# Patient Record
Sex: Female | Born: 1960 | Hispanic: Yes | Marital: Married
Health system: Southern US, Community
[De-identification: ages and names within clinical notes are randomized; demographics above are authoritative.]

---

## 2005-11-22 ENCOUNTER — Ambulatory Visit: Payer: Self-pay | Admitting: Gastroenterology

## 2005-11-25 ENCOUNTER — Ambulatory Visit: Payer: Self-pay | Admitting: Gastroenterology

## 2005-12-09 ENCOUNTER — Ambulatory Visit: Payer: Self-pay | Admitting: Gastroenterology

## 2008-01-25 ENCOUNTER — Emergency Department: Payer: Self-pay | Admitting: Emergency Medicine

## 2011-07-30 ENCOUNTER — Ambulatory Visit: Payer: Self-pay | Admitting: Family Medicine

## 2012-03-06 ENCOUNTER — Ambulatory Visit: Payer: Self-pay | Admitting: Family Medicine

## 2013-03-15 ENCOUNTER — Ambulatory Visit: Payer: Self-pay

## 2020-11-21 ENCOUNTER — Other Ambulatory Visit: Payer: Self-pay

## 2020-11-21 ENCOUNTER — Encounter: Payer: Self-pay | Admitting: Family Medicine

## 2020-11-21 ENCOUNTER — Ambulatory Visit
Admission: RE | Admit: 2020-11-21 | Discharge: 2020-11-21 | Disposition: A | Discharge status: Home / Self Care | Payer: Self-pay | Source: Ambulatory Visit | Attending: Oncology | Admitting: Oncology

## 2020-11-21 ENCOUNTER — Ambulatory Visit: Payer: Self-pay | Attending: Oncology

## 2020-11-21 VITALS — BP 137/75 | HR 69 | Temp 98.0°F | Resp 18 | Wt 180.3 lb

## 2020-11-21 DIAGNOSIS — Z0000 Encounter for general adult medical examination without abnormal findings: Secondary | ICD-10-CM | POA: Insufficient documentation

## 2020-11-21 NOTE — Progress Notes (Signed)
  Subjective:     Patient ID: Emanuel Otilio Miu, female   DOB: 09-18-1960, 60 y.o.   MRN: [id number]  HPI   Review of Systems     Objective:   Physical Exam Chest:  Breasts:    Right: No swelling, bleeding, inverted nipple, mass, nipple discharge, skin change or tenderness.     Left: No swelling, bleeding, inverted nipple, mass, nipple discharge, skin change or tenderness.       Assessment:     60 year old Hispanic patient presents for [facility] screening.  Patient screened, and meets [facility] eligibility.  Patient does not have insurance, Medicare or Medicaid.  Instructed patient on breast self awareness using teach back method.   Clinical breast exam unremarkable.  No mass or lump palpated.   Risk Assessment     Risk Scores       11/21/2020   Last edited by: Stephanie Coup S, CMA   5-year risk: 0.6 %   Lifetime risk: 3.4 %                Plan:     Sent for bilateral screening mammogram.

## 2020-11-26 NOTE — Progress Notes (Signed)
Letter mailed from [facility] to notify of normal mammogram results.  Patient to return in one year for annual screening.  Copy to [facility]. 

## 2021-11-12 ENCOUNTER — Encounter: Payer: Self-pay | Admitting: Family Medicine

## 2022-01-15 ENCOUNTER — Other Ambulatory Visit: Payer: Self-pay

## 2022-01-15 DIAGNOSIS — Z1231 Encounter for screening mammogram for malignant neoplasm of breast: Secondary | ICD-10-CM

## 2022-01-21 ENCOUNTER — Ambulatory Visit: Payer: Self-pay | Attending: Hematology and Oncology

## 2022-01-21 ENCOUNTER — Telehealth: Payer: Self-pay | Admitting: Hematology and Oncology

## 2023-08-05 IMAGING — MG MM DIGITAL SCREENING BILAT W/ TOMO AND CAD
6 of 12 series · 6 of 36 positions shown · non-contrast
Comparison: Previous exam(s).

CLINICAL DATA: Screening.

EXAM:
DIGITAL SCREENING BILATERAL MAMMOGRAM WITH TOMOSYNTHESIS AND CAD
TECHNIQUE: Bilateral screening digital craniocaudal and mediolateral oblique
mammograms were obtained. Bilateral screening digital breast
tomosynthesis was performed. The images were evaluated with
computer-aided detection.

[L CC synth-2D (1 of 2)]
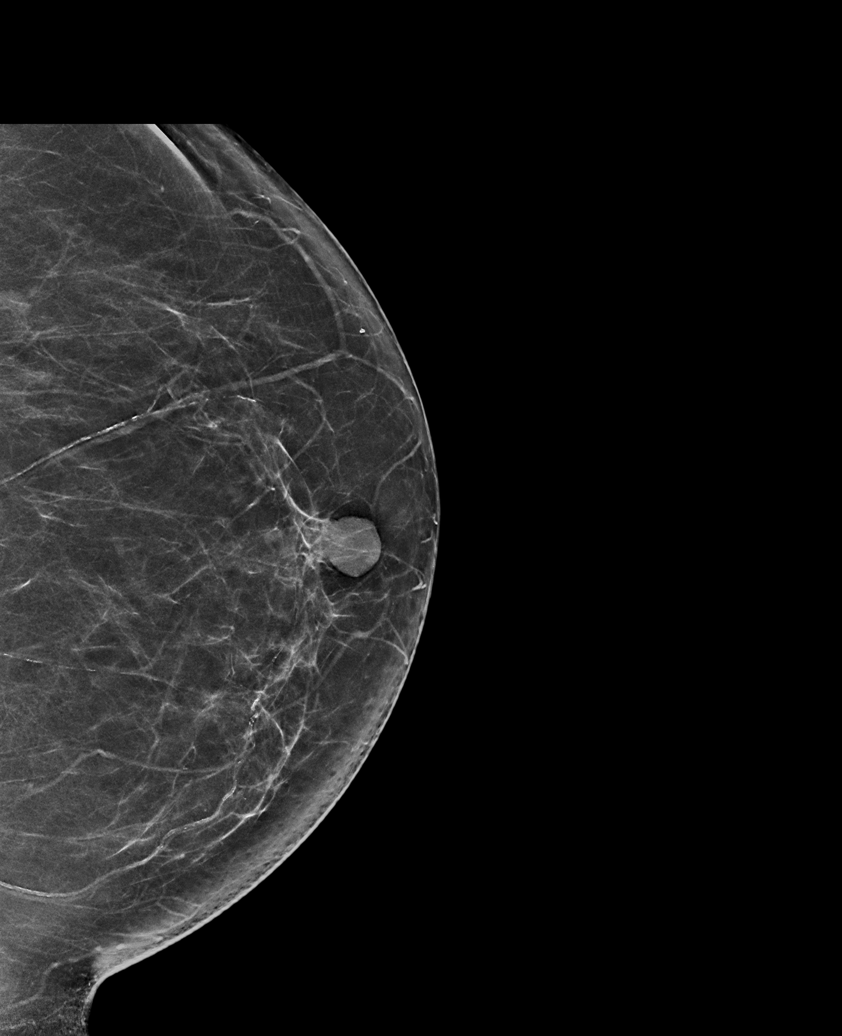

[L MLO synth-2D]
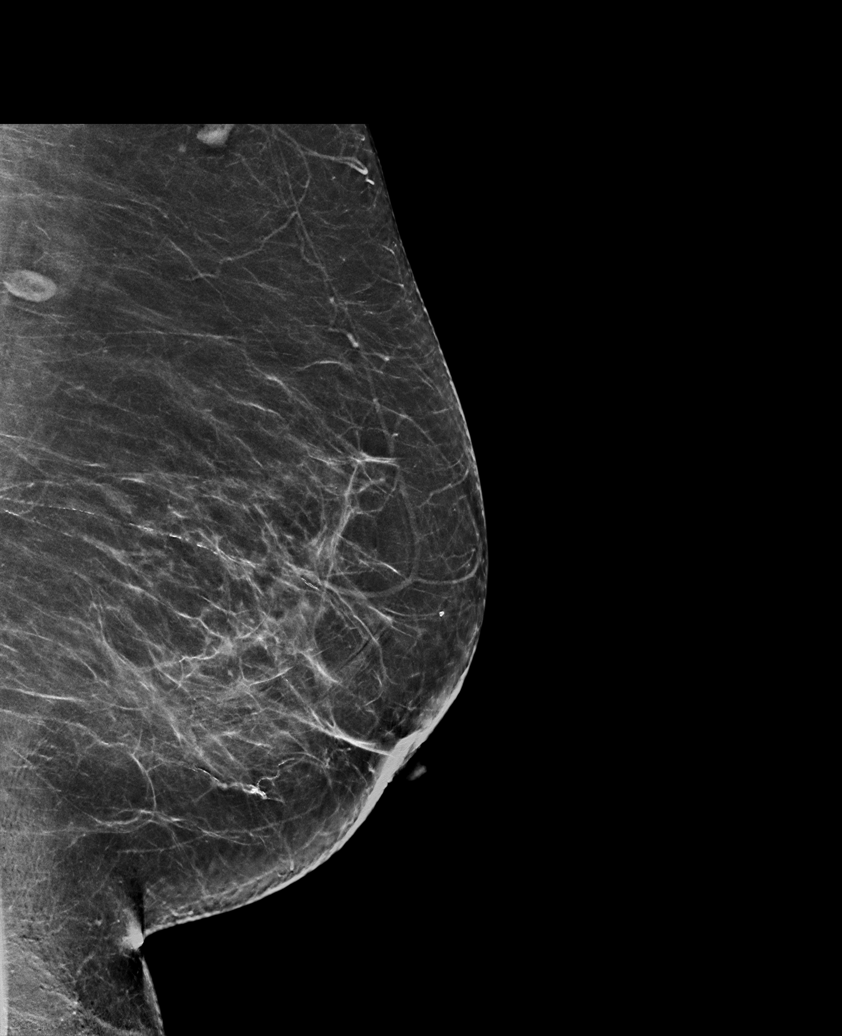

[R CC synth-2D (1 of 2)]
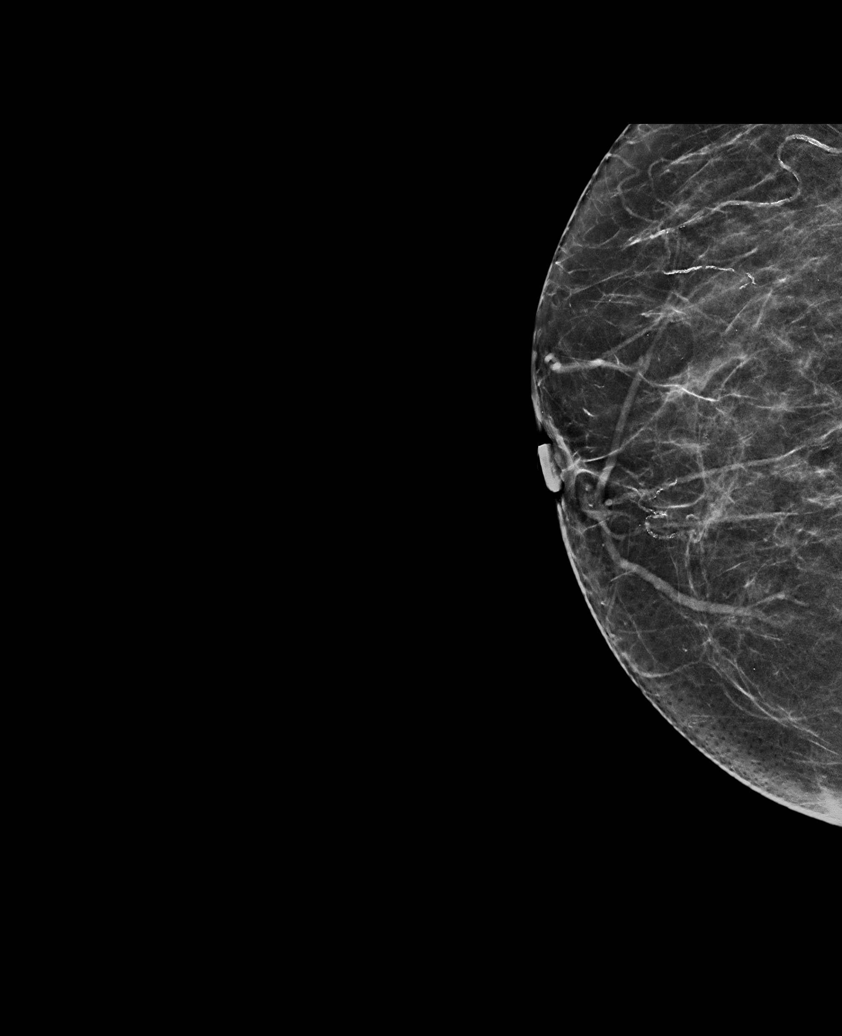

[L CC synth-2D (2 of 2)]
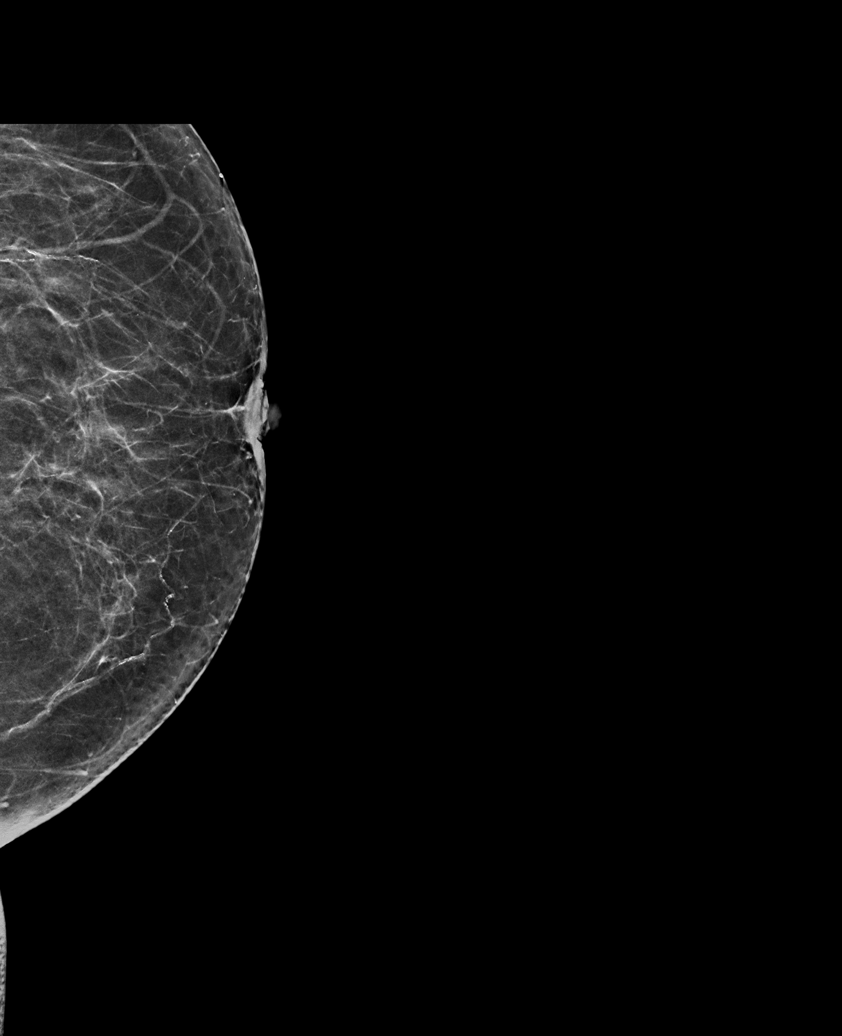

[R CC synth-2D (2 of 2)]
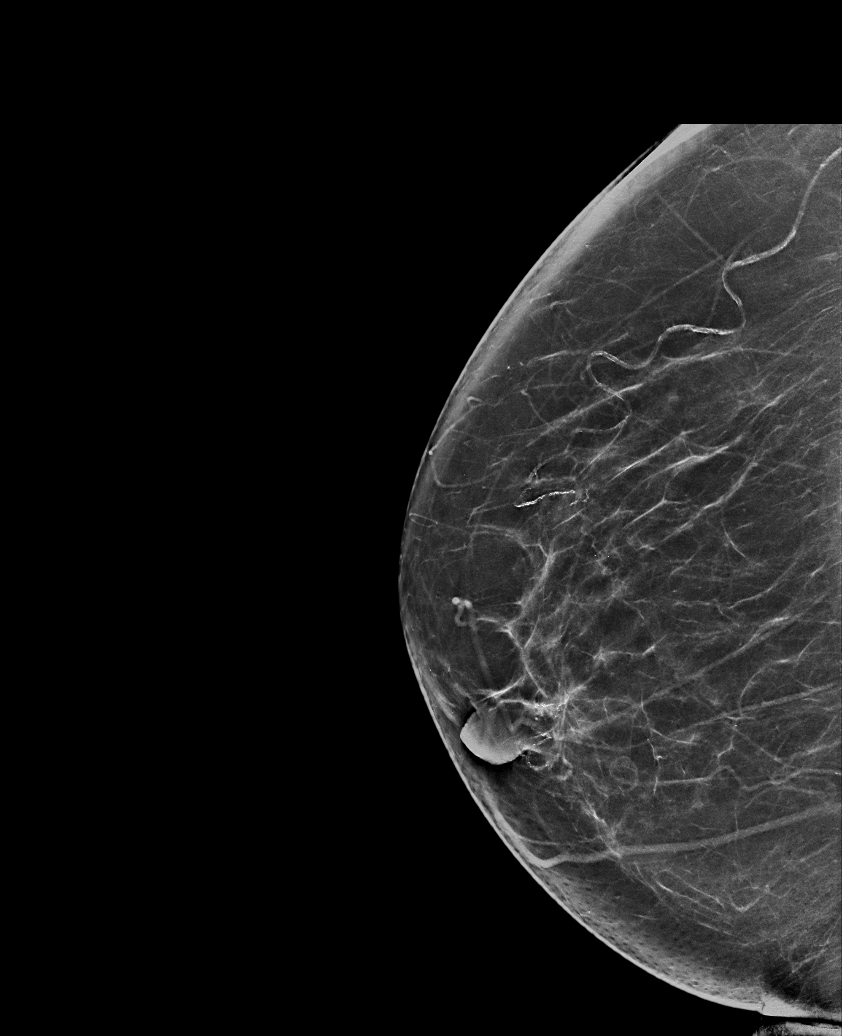

[R MLO synth-2D]
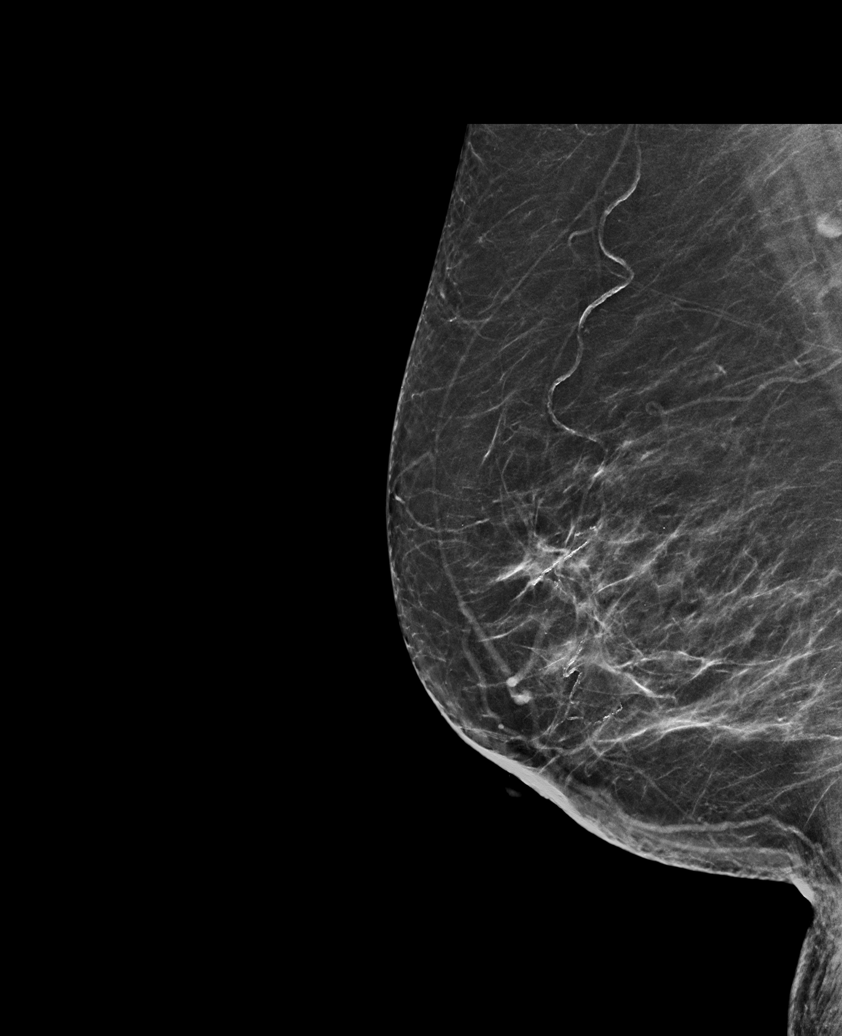

[6 of 36 positions shown; findings below may reference images not displayed]

ACR Breast Density Category b: There are scattered areas of
fibroglandular density.
FINDINGS: There are no findings suspicious for malignancy.
IMPRESSION: No mammographic evidence of malignancy. A result letter of this
screening mammogram will be mailed directly to the patient.

RECOMMENDATION:
Screening mammogram in one year. (Code:51-O-LD2)

BI-RADS CATEGORY  1: Negative.
